# Patient Record
Sex: Male | Born: 1985 | Race: White | Hispanic: No | Marital: Single | Smoking: Never smoker
Health system: Southern US, Community
[De-identification: ages and names within clinical notes are randomized; demographics above are authoritative.]

## PROBLEM LIST (undated history)

## (undated) DIAGNOSIS — F329 Major depressive disorder, single episode, unspecified: Secondary | ICD-10-CM

## (undated) DIAGNOSIS — F32A Depression, unspecified: Secondary | ICD-10-CM

---

## 2004-06-27 ENCOUNTER — Emergency Department (HOSPITAL_COMMUNITY): Admission: EM | Admit: 2004-06-27 | Discharge: 2004-06-27 | Payer: Self-pay | Admitting: Emergency Medicine

## 2007-10-12 ENCOUNTER — Observation Stay (HOSPITAL_COMMUNITY): Admission: EM | Admit: 2007-10-12 | Discharge: 2007-10-13 | Payer: Self-pay | Admitting: Emergency Medicine

## 2007-10-12 ENCOUNTER — Ambulatory Visit: Payer: Self-pay | Admitting: Cardiology

## 2008-12-14 ENCOUNTER — Ambulatory Visit (HOSPITAL_BASED_OUTPATIENT_CLINIC_OR_DEPARTMENT_OTHER): Admission: RE | Admit: 2008-12-14 | Discharge: 2008-12-14 | Payer: Self-pay | Admitting: Family Medicine

## 2008-12-20 ENCOUNTER — Ambulatory Visit: Payer: Self-pay | Admitting: Internal Medicine

## 2010-12-10 ENCOUNTER — Emergency Department (HOSPITAL_COMMUNITY)
Admission: EM | Admit: 2010-12-10 | Discharge: 2010-12-11 | Disposition: A | Discharge status: Home / Self Care | Payer: Commercial Managed Care - PPO | Attending: Emergency Medicine | Admitting: Emergency Medicine

## 2010-12-10 DIAGNOSIS — F17200 Nicotine dependence, unspecified, uncomplicated: Secondary | ICD-10-CM | POA: Insufficient documentation

## 2010-12-10 DIAGNOSIS — R079 Chest pain, unspecified: Secondary | ICD-10-CM | POA: Insufficient documentation

## 2010-12-11 ENCOUNTER — Emergency Department (HOSPITAL_COMMUNITY): Payer: Commercial Managed Care - PPO

## 2011-02-07 NOTE — Procedures (Signed)
NAME:  Jay Jacobs, Jay Jacobs             ACCOUNT NO.:  [id number]   MEDICAL RECORD NO.:  [id number]          PATIENT TYPE:  OUT   LOCATION:  SLEEP CENTER                 FACILITY:  [facility]   PHYSICIAN:  Clinton D. Maple Hudson, MD, FCCP, FACPDATE OF BIRTH:  11/22/1983   DATE OF STUDY:  12/14/2008                            NOCTURNAL POLYSOMNOGRAM   REFERRING PHYSICIAN:  Tammy R. Collins Scotland, M.D.   INDICATION FOR STUDY:  Hypersomnia with sleep apnea.   Epworth sleepiness score 7/24, BMI 43.4, weight 320 pounds, height 72  inches, and neck 18 inches.  Home medications are charted and reviewed.  This patient works a rotating shift.  The study was done as a daytime  study beginning at 8:27 a.m. in order to accommodate his sleep schedule.   SLEEP ARCHITECTURE:  Total sleep time 374 minutes with sleep efficiency  92.9%.  Stage I was 4.4%, stage II 67.9%, stage III 16.6%, REM 11.1% of  total sleep time.  Sleep latency 2.5 minutes, REM latency 232 minutes,  awake after sleep onset 24 minutes, arousal index 13.2.  Lisinopril and  Lovaza were taken at 7:00 a.m.   RESPIRATORY DATA:  Apnea-hypopnea index (AHI) 0.8 per hour.  A total of  5 events were scored including 4 obstructive apneas and 1 central apnea.  An additional 21 respiratory events related to arousal were counted for  an index of 3.4 per hour and respiratory disturbance index (RDI) of 4.2  total events per hour.  Events were not positional.  REM AHI 1.4.  There  were insufficient events to permit CPAP titration by split protocol on  this study.   OXYGEN DATA:  Loud snoring with oxygen desaturation to a nadir of 90%.  Mean oxygen saturation through the study was 96.8% on room air.   CARDIAC DATA:  Sinus rhythm with occasional PVC.   MOVEMENT/PARASOMNIA:  Occasional limb jerk with insignificant effect on  sleep.   IMPRESSION/RECOMMENDATIONS:  1. This was a daytime study begun at 8:27 a.m. to coordinate with the      patient's work schedule.   Note, that he works a rotating shift and      most patients with rotating shift schedules have disrupted sleep      and increased sleepiness during their awake interval.  2. Occasional respiratory events with sleep disturbance, AHI 0.8 per      hour (normal range 0-5 per hour).  Additional events not meeting      full scoring criteria as apneas or hypopneas were scored.  His      respiratory events related to arousal giving a respiratory      disturbance index (RDI) of 4.2 per hour.  Snoring was loud.  Events      were not positional.  Mean oxygen saturation was normal with      saturation nadir of 90%.  3. Weight loss.  Encouragement to sleep off flat of back and attention      to any correctable nasal or upper airway      obstruction may give some improvement.  If it becomes possible for      him to get a day shift job that  may make the most long-term      difference.      Clinton D. Maple Hudson, MD, [facility], FACP  Diplomate, Biomedical engineer of Sleep Medicine  Electronically Signed     CDY/MEDQ  D:  12/19/2008 16:48:56  T:  12/20/2008 01:33:18  Job:  [id number]

## 2011-02-07 NOTE — H&P (Signed)
[name], Jay Jacobs             ACCOUNT NO.:  [id number]   MEDICAL RECORD NO.:  [id number]          PATIENT TYPE:  EMS   LOCATION:  ED                           FACILITY:  [facility]   PHYSICIAN:  Reginia Forts, MD     DATE OF BIRTH:  1986/01/31   DATE OF ADMISSION:  10/12/2007  DATE OF DISCHARGE:                              HISTORY & PHYSICAL   CHIEF COMPLAINT:  Chest pain and palpitations.   HISTORY OF PRESENT ILLNESS:  Mr. Sales is a 25 year old Caucasian male  with no prior cardiac history who presents with three hours of  palpitations and chest pain.  The patient states that tonight he  consumed heavy amount of cocaine and noticed significant chest pain with  shortness of breath, diaphoresis, lightheadedness and severe  palpitations.  He subsequently presented to the emergency room and was  found to have a heart rate of approximately 230.  He was given IV  diltiazem bolus and drip and was subsequently improved heart rate to the  150s and resolved his symptoms.  He denied any syncope, orthopnea or  paroxysmal nocturnal dyspnea or increased work of breathing.  He states  that he has been eating cocaine for a significant amount of time and has  not had any prior events.   PAST MEDICAL HISTORY:  Obesity.   PAST SURGICAL HISTORY:  None.   ALLERGIES:  No known drug allergies.   MEDICATIONS:  None.   SOCIAL HISTORY:  The patient works for the [employer].  He  denies any tobacco use.  Notes occasional alcohol use and cocaine use.   FAMILY HISTORY:  Negative for any coronary artery disease.   REVIEW OF SYSTEMS:  A 12 review of systems notable for occasional  dyspnea on exertion.   PHYSICAL EXAMINATION:  VITAL SIGNS:  Blood pressure 147/45 with a pulse  rate of 150-160, temperature 97.1.  GENERAL:  The patient is awake, alert and oriented x3, in no acute  distress.  HEENT:  Normocephalic, atraumatic.  Pupils are equal, round and reactive  to light.  Extraocular  movements intact.  NECK:  No JVD.  No carotid bruits.  CARDIOVASCULAR:  Irregularly irregular, tachycardic with no significant  murmur, rub, or gallops.  LUNGS:  Clear to auscultation bilaterally.  ABDOMEN:  Obese.  Positive bowel sounds.  Soft, nontender, nondistended.  EXTREMITIES:  No clubbing, cyanosis or edema.  MUSCULOSKELETAL:  No joint deformities or effusions.  NEUROLOGIC:  Cranial nerves II-XII grossly intact.  No focal  musculoskeletal or sensory deficits.   LABORATORY DATA:  An EKG demonstrates SVT at a rate of 200.  Telemetry  demonstrates rates in the 150s with atrial flutter and variable block.   White count is 18.9, hemoglobin 16.8, platelet count 303,000.  BUN is  13, creatinine 1.35.  Troponin is less than 0.05.  CK is 190, MB 3.6.  Alcohol level is less than 5.  Urine drug screen is pending.   ASSESSMENT/PLAN:  This is a 25 year old Caucasian male with cocaine,  chest pain and cocaine-induced tachycardia.  1. Arrhythmia.  We will continue diltiazem drip and start  Ativan.  2. The patient will be ruled out for myocardial infarction.      Reginia Forts, MD  Electronically Signed     RA/MEDQ  D:  10/12/2007  T:  10/12/2007  Job:  [id number]

## 2011-02-07 NOTE — Discharge Summary (Signed)
NAMEHARVE, SPRADLEY             ACCOUNT NO.:  [id number]   MEDICAL RECORD NO.:  [id number]          PATIENT TYPE:  INP   LOCATION:  1443                         [facility]ACILITY:  [facility]   PHYSICIAN:  Bevelyn Buckles. Bensimhon, MDDATE OF BIRTH:  [birth year]   DATE OF ADMISSION:  10/12/2007  DATE OF DISCHARGE:  10/13/2007                               DISCHARGE SUMMARY   [facility] DISCHARGE SUMMARY   DISCHARGING DIAGNOSES:  1. Atrial fibrillation with rapid ventricular response.  2. Cocaine use.   HOSPITAL COURSE:  Mr. Barmore is a 25 year old male with a history of  obesity but no other significant past medical history.  He developed  chest pain, palpitations and lightheadedness after using a significant  amount of cocaine.  He came to the emergency room.  He was in atrial  fibrillation with rapid ventricular response with heart rates in the 200-  230 range.  He was treated with IV diltiazem and anxiolytics.  He  converted back to sinus rhythm spontaneously in under 12 hours.  This  morning he is feeling fine.  He was noted to be snoring heavily over  night and a question of sleep apnea was raised.  Cardiac markers were  negative for cocaine-induced myocardial infarction.   DISCHARGING MEDICATIONS:  None.   FOLLOWUP AFTER DISCHARGE:  1. With his primary care doctor which he does not currently have.  I      have asked him to get a primary care doctor.  2. Will consider drug rehabilitation.  I have clearly stated to him      that he needs to avoid cocaine at all costs.   Dictation ended at this point.      Bevelyn Buckles. Bensimhon, MD  Electronically Signed     DRB/MEDQ  D:  10/13/2007  T:  10/13/2007  Job:  [id number]

## 2011-06-15 LAB — POCT CARDIAC MARKERS
CKMB, poc: 3.6
Myoglobin, poc: 190
Myoglobin, poc: 98.3
Operator id: 4531

## 2011-06-15 LAB — URINALYSIS, ROUTINE W REFLEX MICROSCOPIC
Nitrite: NEGATIVE
Specific Gravity, Urine: 1.03
Urobilinogen, UA: 0.2

## 2011-06-15 LAB — BASIC METABOLIC PANEL
BUN: 13
CO2: 26
CO2: 26
Calcium: 9.3
Chloride: 105
Creatinine, Ser: 1.35
GFR calc Af Amer: >60
GFR calc Af Amer: >60
Potassium: 4.3
Sodium: 139

## 2011-06-15 LAB — CBC
Hemoglobin: 14.4
MCHC: 35
MCHC: 35.2
MCV: 83.6
Platelets: 333
RBC: 4.88
RBC: 5.74
WBC: 9.8

## 2011-06-15 LAB — CARDIAC PANEL(CRET KIN+CKTOT+MB+TROPI)
Relative Index: 1.8
Total CK: 136
Troponin I: 0.03

## 2011-06-15 LAB — URINE MICROSCOPIC-ADD ON

## 2011-06-15 LAB — DIFFERENTIAL
Basophils Absolute: 0
Basophils Relative: 0
Monocytes Relative: 4
Neutro Abs: 15.8 — ABNORMAL HIGH
Neutrophils Relative %: 84 — ABNORMAL HIGH

## 2011-06-15 LAB — TROPONIN I: Troponin I: 0.05

## 2011-06-15 LAB — ETHANOL: Alcohol, Ethyl (B): <5

## 2011-06-15 LAB — CK TOTAL AND CKMB (NOT AT ARMC)
CK, MB: 3.6
Relative Index: 1.7
Total CK: 209

## 2011-06-15 LAB — RAPID URINE DRUG SCREEN, HOSP PERFORMED
Amphetamines: NOT DETECTED
Benzodiazepines: NOT DETECTED
Cocaine: POSITIVE — AB

## 2012-03-30 IMAGING — CR DG CHEST 2V
3 series · 3 of 3 positions shown · non-contrast
Comparison: None.

CLINICAL DATA: Left-sided chest pain for 2 days, no injury

CHEST - 2 VIEW

[view not recorded (1 of 3)]
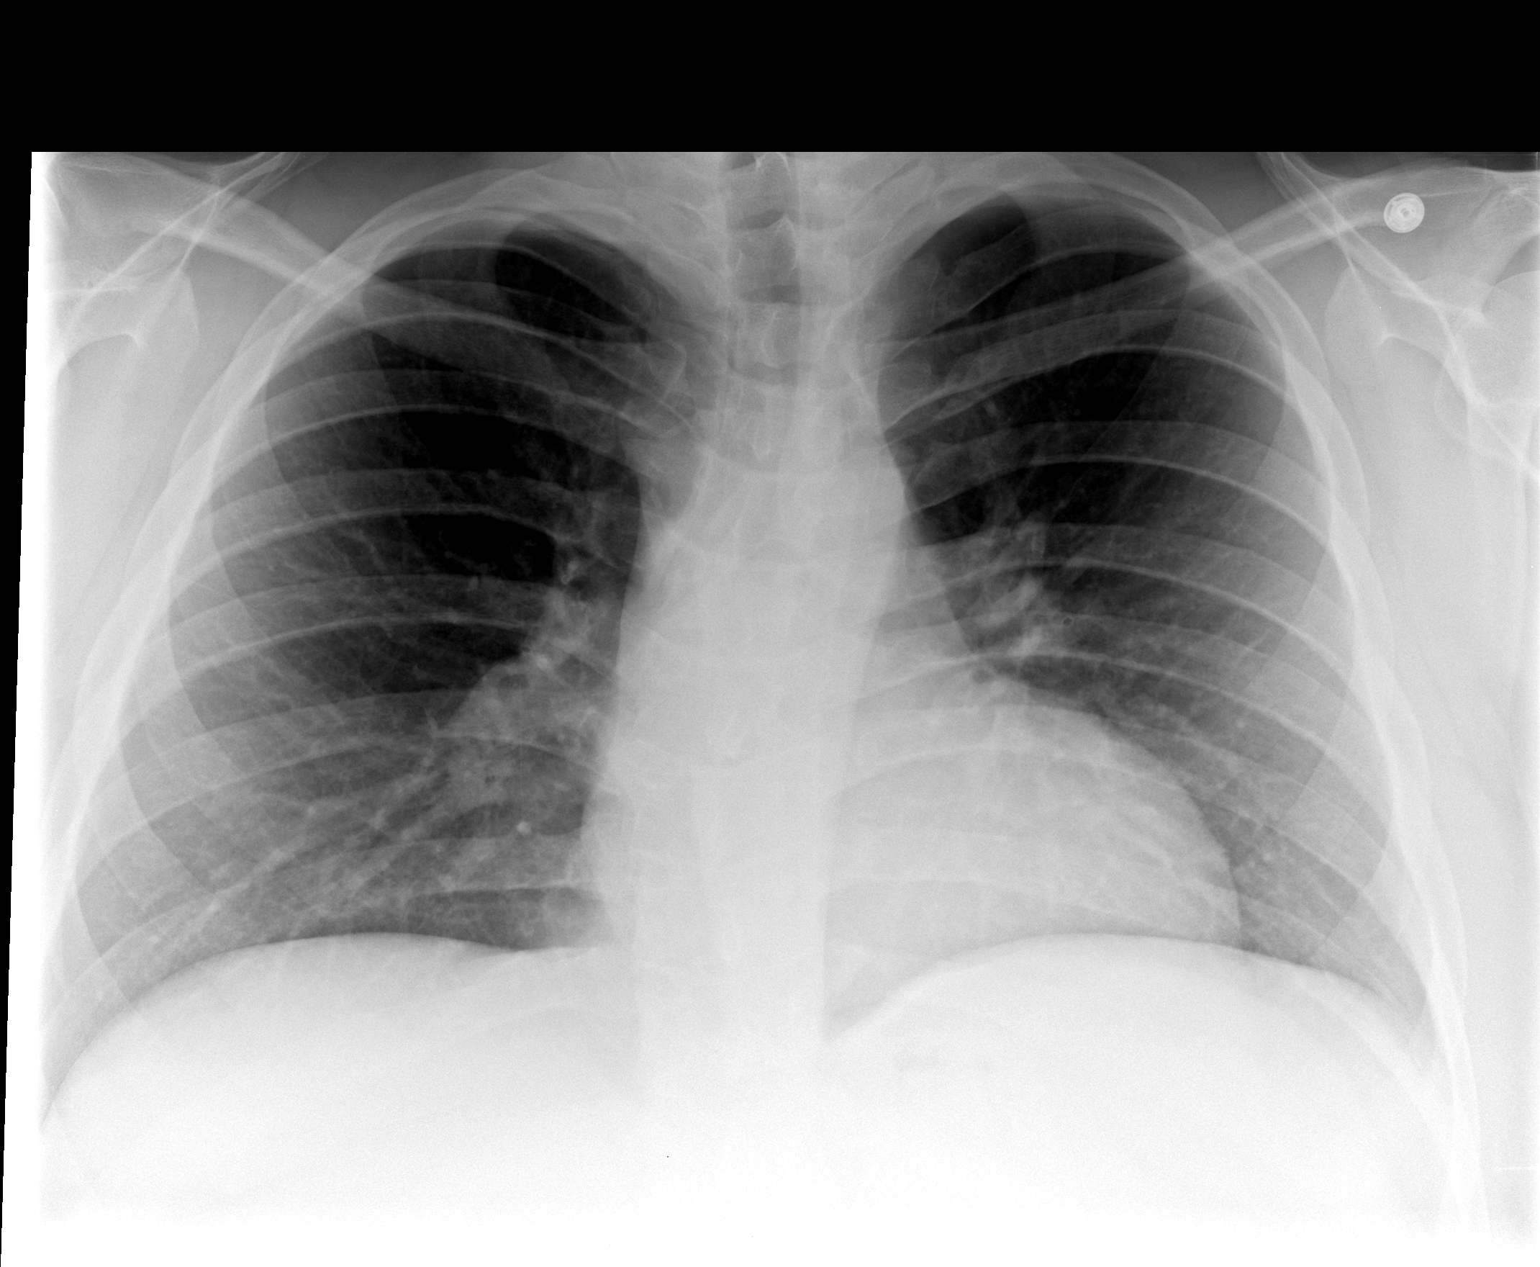

[view not recorded (2 of 3)]
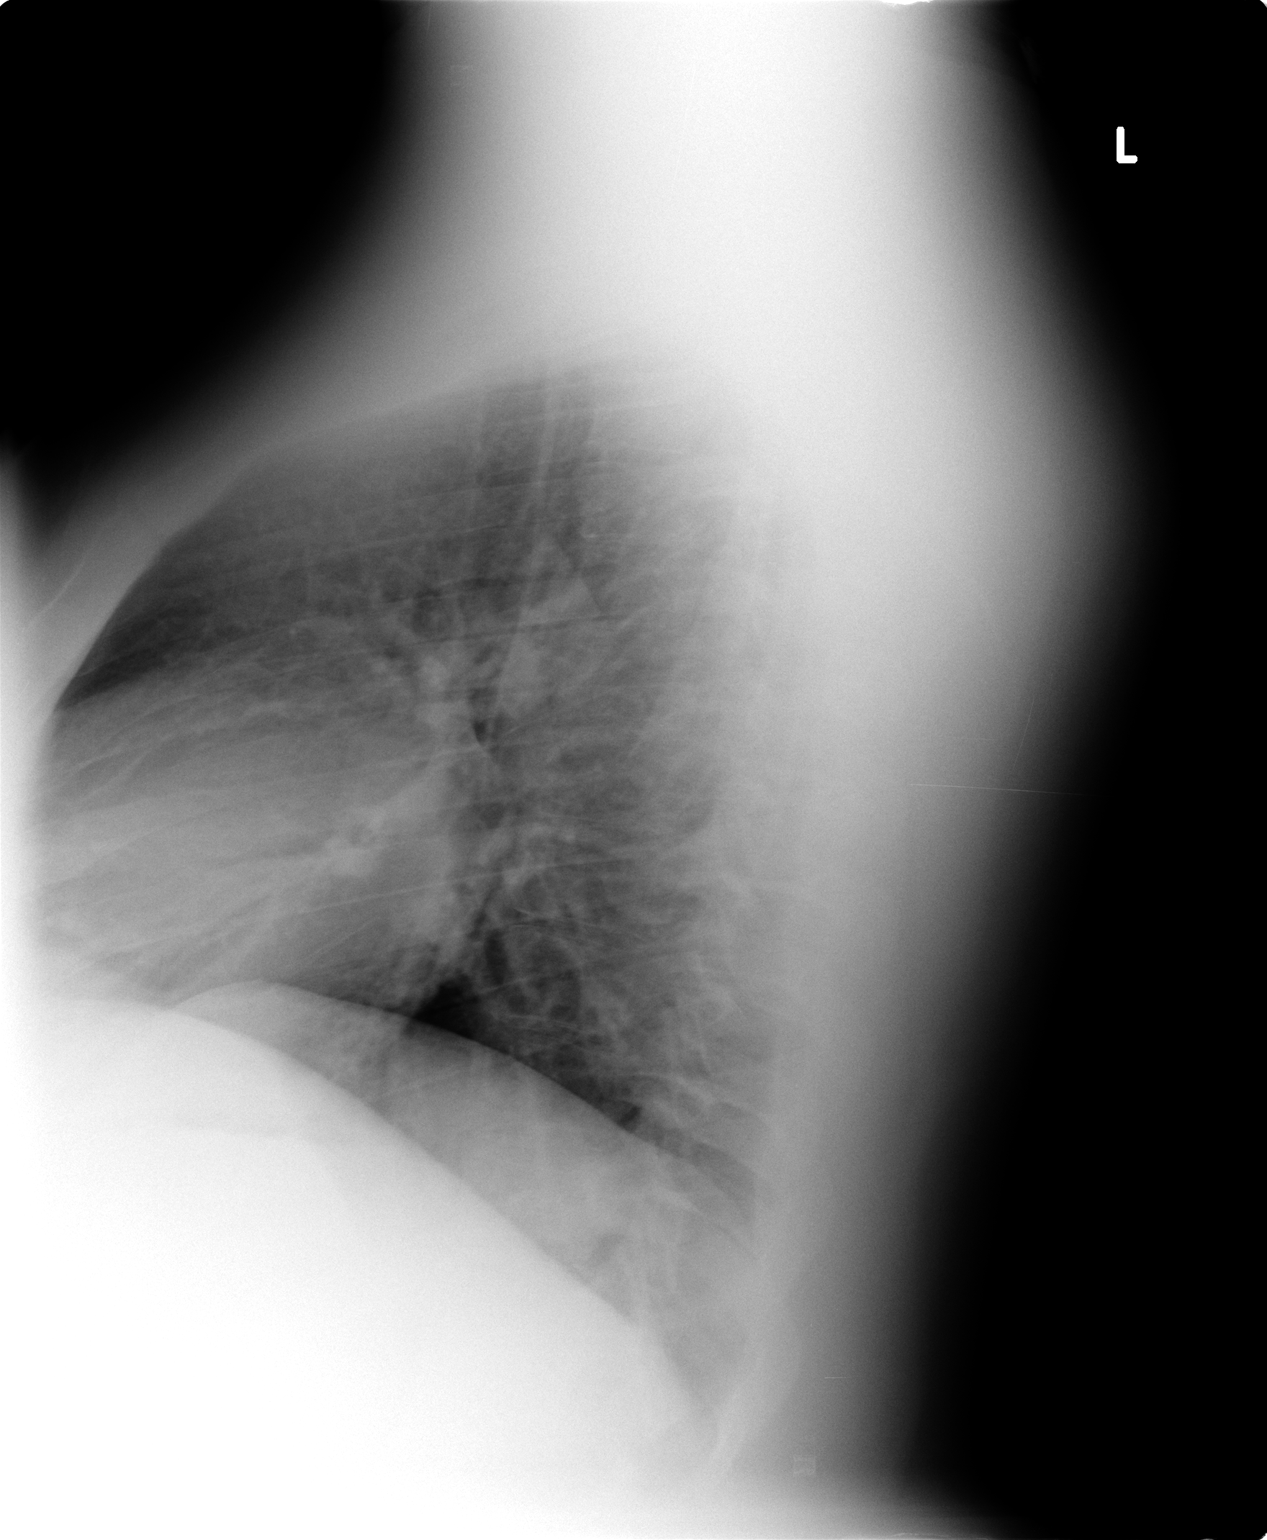

[view not recorded (3 of 3)]
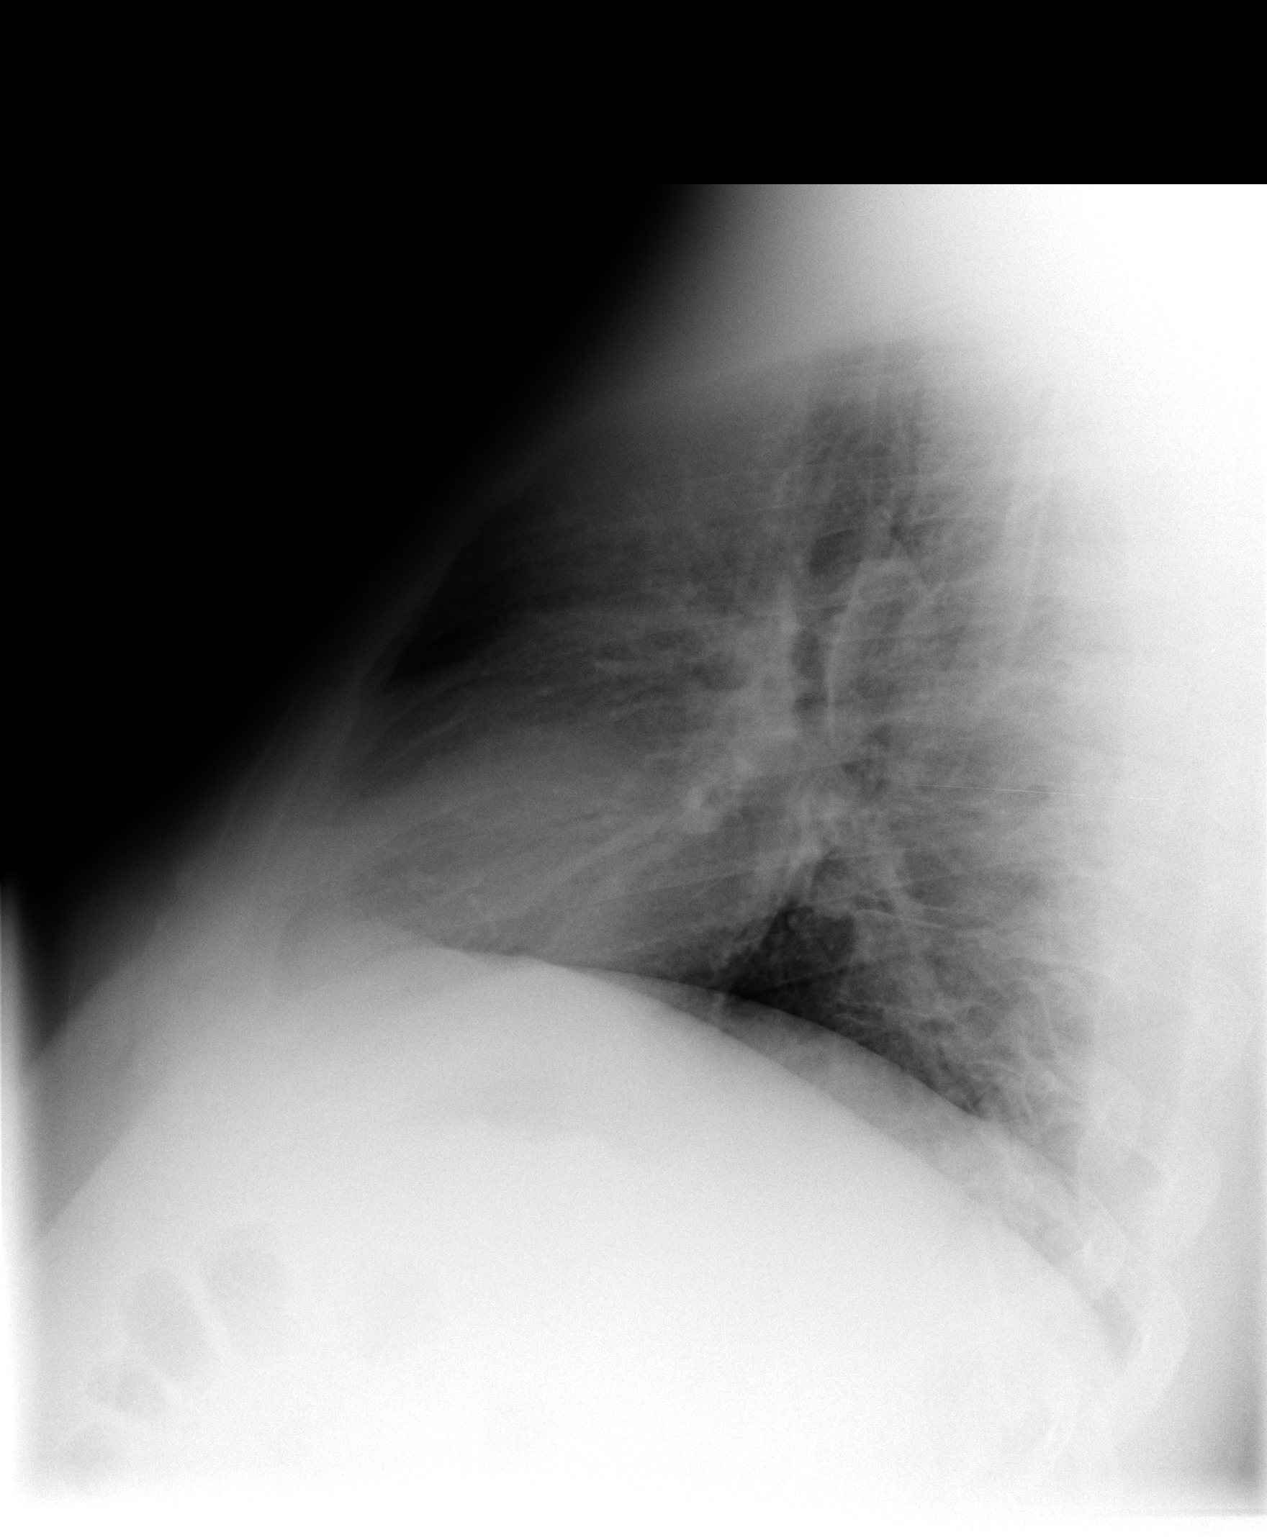

[3 of 3 positions shown; findings below may reference images not displayed]

FINDINGS: The lungs are clear.  The heart is within normal limits
in size.  Mediastinal contours appear normal.  No bony abnormality
is seen.
IMPRESSION: No active lung disease.

## 2015-12-17 ENCOUNTER — Encounter (HOSPITAL_COMMUNITY): Payer: Self-pay

## 2015-12-17 ENCOUNTER — Emergency Department (HOSPITAL_COMMUNITY)
Admission: EM | Admit: 2015-12-17 | Discharge: 2015-12-17 | Disposition: A | Discharge status: Home / Self Care | Payer: PRIVATE HEALTH INSURANCE | Attending: Emergency Medicine | Admitting: Emergency Medicine

## 2015-12-17 DIAGNOSIS — Z79899 Other long term (current) drug therapy: Secondary | ICD-10-CM | POA: Diagnosis not present

## 2015-12-17 DIAGNOSIS — J309 Allergic rhinitis, unspecified: Secondary | ICD-10-CM | POA: Diagnosis not present

## 2015-12-17 DIAGNOSIS — F329 Major depressive disorder, single episode, unspecified: Secondary | ICD-10-CM | POA: Insufficient documentation

## 2015-12-17 DIAGNOSIS — R0981 Nasal congestion: Secondary | ICD-10-CM | POA: Diagnosis present

## 2015-12-17 HISTORY — DX: Depression, unspecified: F32.A

## 2015-12-17 HISTORY — DX: Major depressive disorder, single episode, unspecified: F32.9

## 2015-12-17 MED ORDER — FLUTICASONE PROPIONATE 50 MCG/ACT NA SUSP: 2.0000 | Freq: Every day | NASAL | Status: AC

## 2015-12-17 MED ORDER — DEXAMETHASONE 4 MG PO TABS
10.0000 mg | ORAL_TABLET | Freq: Once | ORAL | Status: AC
  Administered 2015-12-17: 10 mg via ORAL
  Filled 2015-12-17: qty 3

## 2015-12-17 MED ORDER — CETIRIZINE HCL 10 MG PO CAPS: 10.0000 mg | ORAL_CAPSULE | Freq: Every day | ORAL | Status: AC

## 2015-12-17 NOTE — Discharge Instructions (Signed)
You may use Benadryl 50 mg every night at bedtime. You may use Afrin nasal spray for nasal congestion but do not use this medication for more than 3 days in a row as it may worsen your nasal congestion. I do not feel you need to be on antibiotics at this time. Your symptoms are likely caused by allergies or a viral illness. Treatment would be supportive including rest, increase fluid intake, antihistamines, nasal steroids, alternating Tylenol and ibuprofen.   Allergic Rhinitis Allergic rhinitis is when the mucous membranes in the nose respond to allergens. Allergens are particles in the air that cause your body to have an allergic reaction. This causes you to release allergic antibodies. Through a chain of events, these eventually cause you to release histamine into the blood stream. Although meant to protect the body, it is this release of histamine that causes your discomfort, such as frequent sneezing, congestion, and an itchy, runny nose.  CAUSES Seasonal allergic rhinitis (hay fever) is caused by pollen allergens that may come from grasses, trees, and weeds. Year-round allergic rhinitis (perennial allergic rhinitis) is caused by allergens such as house dust mites, pet dander, and mold spores. SYMPTOMS  Nasal stuffiness (congestion).  Itchy, runny nose with sneezing and tearing of the eyes. DIAGNOSIS Your health care provider can help you determine the allergen or allergens that trigger your symptoms. If you and your health care provider are unable to determine the allergen, skin or blood testing may be used. Your health care provider will diagnose your condition after taking your health history and performing a physical exam. Your health care provider may assess you for other related conditions, such as asthma, pink eye, or an ear infection. TREATMENT Allergic rhinitis does not have a cure, but it can be controlled by:  Medicines that block allergy symptoms. These may include allergy shots,  nasal sprays, and oral antihistamines.  Avoiding the allergen. Hay fever may often be treated with antihistamines in pill or nasal spray forms. Antihistamines block the effects of histamine. There are over-the-counter medicines that may help with nasal congestion and swelling around the eyes. Check with your health care provider before taking or giving this medicine. If avoiding the allergen or the medicine prescribed do not work, there are many new medicines your health care provider can prescribe. Stronger medicine may be used if initial measures are ineffective. Desensitizing injections can be used if medicine and avoidance does not work. Desensitization is when a patient is given ongoing shots until the body becomes less sensitive to the allergen. Make sure you follow up with your health care provider if problems continue. HOME CARE INSTRUCTIONS It is not possible to completely avoid allergens, but you can reduce your symptoms by taking steps to limit your exposure to them. It helps to know exactly what you are allergic to so that you can avoid your specific triggers. SEEK MEDICAL CARE IF:  You have a fever.  You develop a cough that does not stop easily (persistent).  You have shortness of breath.  You start wheezing.  Symptoms interfere with normal daily activities.   This information is not intended to replace advice given to you by your health care provider. Make sure you discuss any questions you have with your health care provider.   Document Released: 06/06/2001 Document Revised: 10/02/2014 Document Reviewed: 05/19/2013 Elsevier Interactive Patient Education [employer]2016 Elsevier Inc.

## 2015-12-17 NOTE — ED Provider Notes (Signed)
TIME SEEN: 6:00 AM  CHIEF COMPLAINT: Cough, nasal congestion, tearing of his eyes, sore throat  HPI: Pt is a 30 y.o. male with history of depression who presents to the emergency department with 2 days of dry cough, sinus congestion and drainage, tearing of his eyes and mild sore throat. No fevers or chills. No vomiting or diarrhea. No sick contacts. Did not have an influenza vaccination last year. No rash. Reports he does have a history of seasonal allergies.  ROS: See HPI Constitutional: no fever  Eyes: no drainage  ENT:  runny nose   Cardiovascular:  no chest pain  Resp: no SOB  GI: no vomiting GU: no dysuria Integumentary: no rash  Allergy: no hives  Musculoskeletal: no leg swelling  Neurological: no slurred speech ROS otherwise negative  PAST MEDICAL HISTORY/PAST SURGICAL HISTORY:  Past Medical History  Diagnosis Date  . Depression     MEDICATIONS:  Prior to Admission medications   Medication Sig Start Date End Date Taking? Authorizing Provider  escitalopram (LEXAPRO) 10 MG tablet Take 10 mg by mouth daily.   Yes Historical Provider, MD    ALLERGIES:  No Known Allergies  SOCIAL HISTORY:  Social History  Substance Use Topics  . Smoking status: Never Smoker   . Smokeless tobacco: Not on file  . Alcohol Use: No    FAMILY HISTORY: No family history on file.  EXAM: CONSTITUTIONAL: Alert and oriented and responds appropriately to questions. Well-appearing; well-nourished HEAD: Normocephalic EYES: Conjunctivae clear, PERRL; no purulent discharge, extraocular movements intact ENT: normal nose; no rhinorrhea; moist mucous membranes; No pharyngeal erythema or petechiae, no tonsillar hypertrophy or exudate, no uvular deviation, no trismus or drooling, normal phonation, no stridor, no dental caries or abscess noted, no Ludwig's angina, tongue sits flat in the bottom of the mouth NECK: Supple, no meningismus, no LAD  CARD: RRR; S1 and S2 appreciated; no murmurs, no clicks,  no rubs, no gallops RESP: Normal chest excursion without splinting or tachypnea; breath sounds clear and equal bilaterally; no wheezes, no rhonchi, no rales, no hypoxia or respiratory distress, speaking full sentences ABD/GI: Normal bowel sounds; non-distended; soft, non-tender, no rebound, no guarding, no peritoneal signs BACK:  The back appears normal and is non-tender to palpation, there is no CVA tenderness EXT: Normal ROM in all joints; non-tender to palpation; no edema; normal capillary refill; no cyanosis, no calf tenderness or swelling    SKIN: Normal color for age and race; warm; no rash NEURO: Moves all extremities equally, sensation to light touch intact diffusely, cranial nerves II through XII intact PSYCH: The patient's mood and manner are appropriate. Grooming and personal hygiene are appropriate.  MEDICAL DECISION MAKING: Patient here with likely allergic rhinitis versus viral sinusitis. No signs of deep space neck infection, peritonsillar abscess, strep pharyngitis, pneumonia, meningitis. I have recommended that he alternate Tylenol and ibuprofen for fever and pain if he develops fever. Have recommended he use steroid nasal spray, antihistamines. We'll give him a dose of Decadron in the emergency department for symptomatic relief. I do not feel antibody to be helpful at this time. He is very well-appearing and in no distress.    I do not feel there is any life-threatening condition present. I have reviewed and discussed all results (EKG, imaging, lab, urine as appropriate), exam findings with patient. I have reviewed nursing notes and appropriate previous records.  I feel the patient is safe to be discharged home without further emergent workup. Discussed usual and customary return precautions. Patient  and family (if present) verbalize understanding and are comfortable with this plan.  Patient will follow-up with their primary care provider. If they do not have a primary care provider,  information for follow-up has been provided to them. All questions have been answered.      Layla MawKristen N Jennalyn Cawley, DO 12/17/15 [phone]

## 2015-12-17 NOTE — ED Notes (Signed)
Cough, Congestion, nasal drainage, puffy eyes, sore throat x 2 days

## 2017-01-22 ENCOUNTER — Ambulatory Visit (INDEPENDENT_AMBULATORY_CARE_PROVIDER_SITE_OTHER): Payer: PRIVATE HEALTH INSURANCE | Admitting: Otolaryngology

## 2017-01-30 ENCOUNTER — Other Ambulatory Visit (HOSPITAL_BASED_OUTPATIENT_CLINIC_OR_DEPARTMENT_OTHER): Payer: Self-pay

## 2017-01-30 DIAGNOSIS — G4710 Hypersomnia, unspecified: Secondary | ICD-10-CM

## 2017-01-30 DIAGNOSIS — R5383 Other fatigue: Secondary | ICD-10-CM

## 2017-01-30 DIAGNOSIS — R0683 Snoring: Secondary | ICD-10-CM

## 2017-02-06 ENCOUNTER — Ambulatory Visit: Payer: 59 | Attending: Family | Admitting: Neurology

## 2017-02-06 ENCOUNTER — Encounter (INDEPENDENT_AMBULATORY_CARE_PROVIDER_SITE_OTHER): Payer: Self-pay

## 2017-02-06 DIAGNOSIS — R5383 Other fatigue: Secondary | ICD-10-CM | POA: Insufficient documentation

## 2017-02-06 DIAGNOSIS — G4733 Obstructive sleep apnea (adult) (pediatric): Secondary | ICD-10-CM | POA: Insufficient documentation

## 2017-02-06 DIAGNOSIS — G4710 Hypersomnia, unspecified: Secondary | ICD-10-CM | POA: Insufficient documentation

## 2017-02-06 DIAGNOSIS — R0683 Snoring: Secondary | ICD-10-CM | POA: Diagnosis present

## 2017-02-23 NOTE — Procedures (Signed)
   [facility] Foy Vanduyne A. Gerilyn Pilgrimoonquah, MD     [email or web address]             NOCTURNAL POLYSOMNOGRAPHY   LOCATION: [facility]   Patient Name: [name], [name] Study Date: 02/06/2017 Gender: Male D.O.B: 07-Oct-1985 Age (years): 31 Referring Provider: Altamease OilerMeredith L Harris FNP Height (inches): 72 Interpreting Physician: Beryle BeamsKofi Da Michelle MD, ABSM Weight (lbs): 330 RPSGT: Alfonso EllisHedrick, Debra BMI: 45 MRN: [id number] Neck Size: 21.00 CLINICAL INFORMATION Sleep Study Type: NPSG  Indication for sleep study: Excessive Daytime Sleepiness, Fatigue, Snoring  Epworth Sleepiness Score: 5  SLEEP STUDY TECHNIQUE As per the AASM Manual for the Scoring of Sleep and Associated Events v2.3 (April 2016) with a hypopnea requiring 4% desaturations.  The channels recorded and monitored were frontal, central and occipital EEG, electrooculogram (EOG), submentalis EMG (chin), nasal and oral airflow, thoracic and abdominal wall motion, anterior tibialis EMG, snore microphone, electrocardiogram, and pulse oximetry.  MEDICATIONS Medications self-administered by patient taken the night of the study : N/A  Current Outpatient Prescriptions:  .  Cetirizine HCl (ZYRTEC ALLERGY) 10 MG CAPS, Take 1 capsule (10 mg total) by mouth daily., Disp: 30 capsule, Rfl: 0 .  escitalopram (LEXAPRO) 10 MG tablet, Take 10 mg by mouth daily., Disp: , Rfl:  .  fluticasone (FLONASE) 50 MCG/ACT nasal spray, Place 2 sprays into both nostrils daily., Disp: 16 g, Rfl: 0   SLEEP ARCHITECTURE The study was initiated at 10:55:06 PM and ended at 5:20:30 AM.  Sleep onset time was 19.8 minutes and the sleep efficiency was 92.9%. The total sleep time was 358.0 minutes.  Stage REM latency was 267.0 minutes.  The patient spent 2.93% of the night in stage N1 sleep, 62.29% in stage N2 sleep, 27.51% in stage N3 and 7.26% in REM.  Alpha intrusion was absent.  Supine sleep was 18.85%.  RESPIRATORY PARAMETERS The overall  apnea/hypopnea index (AHI) was 10.4 per hour. There were 21 total apneas, including 17 obstructive, 1 central and 3 mixed apneas. There were 41 hypopneas and 38 RERAs.  The AHI during Stage REM sleep was 2.3 per hour.  AHI while supine was 31.1 per hour.  The mean oxygen saturation was 95.08%. The minimum SpO2 during sleep was 89.00%.  Loud snoring was noted during this study.  CARDIAC DATA The 2 lead EKG demonstrated sinus rhythm. The mean heart rate was N/A beats per minute. Other EKG findings include: PVCs. LEG MOVEMENT DATA The total PLMS were 80 with a resulting PLMS index of 13.41. Associated arousal with leg movement index was 7.5.  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 A trial of AutoPAP 10-15 is recommended.  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 Associated arousals were significant.  Argie RammingKofi A Gray Maugeri, MD Diplomate, American Board of Sleep Medicine.  ELECTRONICALLY SIGNED ON:  02/23/2017, 11:26 AM [facility] PH: [phone]   FX: [phone] ACCREDITED BY THE AMERICAN ACADEMY OF SLEEP MEDICINE
